# Patient Record
Sex: Male | Born: 2000
Health system: Southern US, Community
[De-identification: ages and names within clinical notes are randomized; demographics above are authoritative.]

---
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
## 2020-02-26 ENCOUNTER — Ambulatory Visit
Admission: RE | Admit: 2020-02-26 | Discharge: 2020-02-26 | Disposition: A | Discharge status: Home / Self Care | Payer: 59 | Attending: Pediatrics | Admitting: Pediatrics

## 2020-02-26 ENCOUNTER — Other Ambulatory Visit: Payer: Self-pay | Admitting: Pediatrics

## 2020-02-26 ENCOUNTER — Other Ambulatory Visit: Payer: Self-pay

## 2020-02-26 ENCOUNTER — Ambulatory Visit
Admission: RE | Admit: 2020-02-26 | Discharge: 2020-02-26 | Disposition: A | Discharge status: Home / Self Care | Payer: 59 | Source: Ambulatory Visit | Attending: Pediatrics | Admitting: Pediatrics

## 2020-02-26 DIAGNOSIS — M25551 Pain in right hip: Secondary | ICD-10-CM

## 2020-03-18 ENCOUNTER — Other Ambulatory Visit: Payer: Self-pay

## 2020-03-18 ENCOUNTER — Ambulatory Visit: Payer: 59 | Admitting: Family Medicine

## 2020-03-18 ENCOUNTER — Encounter: Payer: Self-pay | Admitting: Family Medicine

## 2020-03-18 VITALS — BP 108/62 | HR 92 | Temp 98.4°F | Resp 18 | Ht 73.0 in | Wt 178.0 lb

## 2020-03-18 DIAGNOSIS — Z024 Encounter for examination for driving license: Secondary | ICD-10-CM

## 2020-03-18 NOTE — Progress Notes (Signed)
Subjective:    Patient ID: Gilbert Moore, male    DOB: 12/29/2000, 20 y.o.   MRN: [id number]  Gilbert Moore is a 20 y.o. male presenting on 03/18/2020 for Employment Physical (DOT Physical)   HPI  Patient presents to clinic for DOT PE  No flowsheet data found.  Social History   Tobacco Use  . Smoking status: Never Smoker  . Smokeless tobacco: Never Used  Vaping Use  . Vaping Use: Every day  . Substances: Nicotine, Flavoring  Substance Use Topics  . Alcohol use: Never  . Drug use: Never    Review of Systems  Constitutional: Negative.   HENT: Negative.   Eyes: Negative.   Respiratory: Negative.   Cardiovascular: Negative.   Gastrointestinal: Negative.   Endocrine: Negative.   Genitourinary: Negative.   Musculoskeletal: Negative.   Skin: Negative.   Allergic/Immunologic: Negative.   Neurological: Negative.   Hematological: Negative.   Psychiatric/Behavioral: Negative.    Per HPI unless specifically indicated above     Objective:    BP 108/62 (BP Location: Left Arm, Patient Position: Sitting, Cuff Size: Normal)   Pulse 92   Temp 98.4 F (36.9 C) (Oral)   Resp 18   Ht 6\' 1"  (1.854 m)   Wt 178 lb (80.7 kg)   SpO2 99%   BMI 23.48 kg/m   Wt Readings from Last 3 Encounters:  03/18/20 178 lb (80.7 kg) (79 %, Z= 0.81)*   * Growth percentiles are based on CDC (Boys, 2-20 Years) data.    Physical Exam Vitals reviewed.  Constitutional:      General: He is not in acute distress.    Appearance: Normal appearance. He is well-developed, well-groomed and normal weight. He is not ill-appearing or toxic-appearing.  HENT:     Head: Normocephalic.     Right Ear: Tympanic membrane, ear canal and external ear normal. There is no impacted cerumen.     Left Ear: Tympanic membrane, ear canal and external ear normal. There is no impacted cerumen.     Nose: Nose normal. No congestion or rhinorrhea.     Mouth/Throat:     Mouth: Mucous membranes are moist.      Pharynx: Oropharynx is clear. No oropharyngeal exudate or posterior oropharyngeal erythema.  Eyes:     General: Lids are normal. Vision grossly intact. No scleral icterus.       Right eye: No discharge.        Left eye: No discharge.     Extraocular Movements: Extraocular movements intact.     Conjunctiva/sclera: Conjunctivae normal.     Pupils: Pupils are equal, round, and reactive to light.  Cardiovascular:     Rate and Rhythm: Normal rate and regular rhythm.     Pulses: Normal pulses.          Dorsalis pedis pulses are 2+ on the right side and 2+ on the left side.     Heart sounds: Normal heart sounds. No murmur heard. No friction rub. No gallop.   Pulmonary:     Effort: Pulmonary effort is normal. No respiratory distress.     Breath sounds: Normal breath sounds. No wheezing, rhonchi or rales.  Abdominal:     General: Abdomen is flat. Bowel sounds are normal. There is no distension.     Palpations: Abdomen is soft. There is no hepatomegaly, splenomegaly or mass.     Tenderness: There is no abdominal tenderness. There is no right CVA tenderness, left CVA tenderness, guarding or  rebound.     Hernia: No hernia is present.  Musculoskeletal:        General: Normal range of motion.     Cervical back: Normal range of motion and neck supple. No rigidity or tenderness.     Right lower leg: No edema.     Left lower leg: No edema.     Comments: Normal tone, 5/5 strength BUE & BLE  Feet:     Right foot:     Skin integrity: Skin integrity normal.     Left foot:     Skin integrity: Skin integrity normal.  Lymphadenopathy:     Cervical: No cervical adenopathy.  Skin:    General: Skin is warm and dry.     Capillary Refill: Capillary refill takes less than 2 seconds.  Neurological:     General: No focal deficit present.     Mental Status: He is alert and oriented to person, place, and time.     Cranial Nerves: Cranial nerves are intact. No cranial nerve deficit.     Sensory: Sensation is  intact. No sensory deficit.     Motor: Motor function is intact. No weakness.     Coordination: Coordination is intact. Coordination normal.     Gait: Gait is intact. Gait normal.     Deep Tendon Reflexes: Reflexes are normal and symmetric. Reflexes normal.  Psychiatric:        Attention and Perception: Attention and perception normal.        Mood and Affect: Mood and affect normal.        Speech: Speech normal.        Behavior: Behavior normal. Behavior is cooperative.        Thought Content: Thought content normal.        Cognition and Memory: Cognition and memory normal.        Judgment: Judgment normal.    No results found for this or any previous visit.    Assessment & Plan:   Problem List Items Addressed This Visit      Other   Encounter for Department of Transportation (DOT) examination for trucking license - Primary    DOT Certificate provided x 2 years  Hearing test: Pass at 15' Vision: 20/20 R, 20/20 L, 20/20 Both Uncorrected Urine 1.020, Neg Protein, Neg Glucose, Neg Hematuria         No orders of the defined types were placed in this encounter.  Follow up plan: No follow-ups on file.   Charlaine Dalton, FNP Family Nurse Practitioner [facility] 03/18/2020, 8:58 AM

## 2020-03-18 NOTE — Assessment & Plan Note (Signed)
DOT Certificate provided x 2 years  Hearing test: Pass at 15' Vision: 20/20 R, 20/20 L, 20/20 Both Uncorrected Urine 1.020, Neg Protein, Neg Glucose, Neg Hematuria

## 2021-08-17 ENCOUNTER — Encounter: Payer: Self-pay | Admitting: Urology

## 2021-08-17 ENCOUNTER — Other Ambulatory Visit: Payer: Self-pay

## 2021-08-17 ENCOUNTER — Ambulatory Visit: Payer: 59 | Admitting: Urology

## 2021-08-17 VITALS — BP 124/81 | HR 86 | Ht 74.0 in | Wt 192.0 lb

## 2021-08-17 DIAGNOSIS — Z113 Encounter for screening for infections with a predominantly sexual mode of transmission: Secondary | ICD-10-CM

## 2021-08-17 DIAGNOSIS — N451 Epididymitis: Secondary | ICD-10-CM

## 2021-08-17 DIAGNOSIS — N50819 Testicular pain, unspecified: Secondary | ICD-10-CM

## 2021-08-17 MED ORDER — DOXYCYCLINE HYCLATE 100 MG PO CAPS: 100.0000 mg | ORAL_CAPSULE | Freq: Two times a day (BID) | ORAL | 0 refills | Status: AC

## 2021-08-17 MED ORDER — CEFTRIAXONE SODIUM 1 G IJ SOLR
1.0000 g | Freq: Once | INTRAMUSCULAR | Status: AC
  Administered 2021-08-17: 1 g via INTRAMUSCULAR

## 2021-08-17 MED ORDER — CEFTRIAXONE SODIUM 500 MG IJ SOLR: 500.0000 mg | Freq: Once | INTRAMUSCULAR | Status: DC

## 2021-08-17 NOTE — Patient Instructions (Signed)
Epididymitis  Epididymitis is inflammation or swelling of the epididymis. This is caused by an infection. The epididymis is a cord-like structure that is located along the top and back part of the testicle. It collects and stores sperm from the testicle. This condition can also cause pain and swelling of the testicle and scrotum. Symptoms usually start suddenly (acute epididymitis). Sometimes epididymitis starts gradually and lasts for a while (chronic epididymitis). Chronic epididymitis may be harder to treat. What are the causes? In men ages 20-40, this condition is usually caused by a bacterial infection or a sexually transmitted infection (STI), such as gonorrhea or chlamydia. In men 40 and older, this condition is usually caused by bacteria from a urinary blockage or from abnormalities in the urinary system. These can result from: Having a tube placed into the bladder (urinary catheter). Having an enlarged or inflamed prostate gland. Having recently had urinary tract surgery. Having a problem with a backward flow of urine (retrograde). In men who have a condition that weakens the body's defense system (immune system), such as human immunodeficiency virus (HIV), this condition can be caused by: Other bacteria, including tuberculosis and syphilis. Viruses. Fungi. Sometimes this condition occurs without infection. This may happen because of trauma or repetitive activities such as sports. What increases the risk? You are more likely to develop this condition if you have: Unprotected sex with more than one partner. Anal sex. Had recent surgery. A urinary catheter. Urinary problems. A suppressed immune system. What are the signs or symptoms? This condition usually begins suddenly with chills, fever, and pain behind the scrotum and in the testicle. Other symptoms include: Swelling of the scrotum, testicle, or both. Pain when ejaculating or urinating. Pain in the back or  abdomen. Nausea. Itching and discharge from the penis. A frequent need to pass urine. Redness, increased warmth, and tenderness of the scrotum. How is this diagnosed? Your health care provider can diagnose this condition based on your symptoms and medical history. Your health care provider will also do a physical exam to check your scrotum and testicle for swelling, pain, and redness. You may also have other tests, including: Testing of discharge from the penis. Testing your urine for infections, such as STIs. Ultrasound to check for blood flow and inflammation. Your health care provider may test you for other STIs, including HIV. How is this treated? Treatment for this condition depends on the cause. If your condition is caused by a bacterial infection, oral antibiotic medicine may be prescribed. If the bacterial infection has spread to your blood, you may need to receive IV antibiotics. For both bacterial and nonbacterial epididymitis, you may be treated with: Rest. Elevation of the scrotum. Pain medicines. Anti-inflammatory medicines. Surgery may be needed if: You have pus buildup in the scrotum (abscess). You have epididymitis that has not responded to other treatments. Follow these instructions at home: Medicines Take over-the-counter and prescription medicines only as told by your health care provider. If you were prescribed an antibiotic medicine, take it as told by your health care provider. Do not stop taking the antibiotic even if your condition improves. Sexual activity If your epididymitis was caused by an STI, avoid sexual activity until your treatment is complete. Inform your sexual partner or partners if you test positive for an STI. They may need to be treated. Do not engage in sexual activity with your partner or partners until their treatment is completed. Managing pain and swelling  If directed, raise (elevate) your scrotum and apply ice.   To do this: Put ice in a  plastic bag. Place a small towel or pillow between your legs. Rest your scrotum on the pillow or towel. Place another towel between your skin and the plastic bag. Leave the ice on for 20 minutes, 2-3 times a day. Remove the ice if your skin turns bright red. This is very important. If you cannot feel pain, heat, or cold, you have a greater risk of damage to the area. Keep your scrotum elevated and supported while resting. Ask your health care provider if you should wear a scrotal support, such as a jockstrap. Wear it as told by your health care provider. Try taking a sitz bath to help with discomfort. This is a warm water bath that is taken while you are sitting down. The water should come up to your hips and should cover your buttocks. Do this 3-4 times per day or as told by your health care provider. General instructions Drink enough fluid to keep your urine pale yellow. Return to your normal activities as told by your health care provider. Ask your health care provider what activities are safe for you. Keep all follow-up visits. This is important. Contact a health care provider if: You have a fever. Your pain medicine is not helping. Your pain is getting worse. Your symptoms do not improve within 3 days. Summary Epididymitis is inflammation or swelling of the epididymis. This is caused by an infection. This condition can also cause pain and swelling of the testicle and scrotum. Treatment for this condition depends on the cause. If your condition is caused by a bacterial infection, oral antibiotic medicine may be prescribed. Inform your sexual partner or partners if you test positive for an STI. They may need to be treated. Do not engage in sexual activity with your partner or partners until their treatment is completed. Contact a health care provider if your symptoms do not improve within 3 days. This information is not intended to replace advice given to you by your health care provider.  Make sure you discuss any questions you have with your health care provider. Document Revised: 09/23/2020 Document Reviewed: 09/23/2020 Elsevier Patient Education  2023 [employer]  

## 2021-08-17 NOTE — Progress Notes (Signed)
[employer]

## 2021-08-17 NOTE — Progress Notes (Signed)
   08/17/21 4:06 PM   Annice Pih Bulthuis 06-27-2000 [id number]  CC: Right scrotal pain  HPI: 21 year old healthy male added to clinic schedule today for right-sided testicular pain.  He reports 1 week of discomfort on the right side.  No aggravating or initiating events.  He is sexually active, never been tested for STDs.  He denies any urinary symptoms.  He reportedly gave a urinalysis at a [facility] facility today, but those results are not available.   Social History:  reports that he has never smoked. He has never been exposed to tobacco smoke. He has never used smokeless tobacco. He reports that he does not drink alcohol and does not use drugs.  Physical Exam: BP 124/81   Pulse 86   Ht 6\' 2"  (1.88 m)   Wt 192 lb (87.1 kg)   BMI 24.65 kg/m    Constitutional:  Alert and oriented, No acute distress. Cardiovascular: No clubbing, cyanosis, or edema. Respiratory: Normal respiratory effort, no increased work of breathing. GI: Abdomen is soft, nontender, nondistended, no abdominal masses GU: Phallus with patent meatus, no lesions, left testicle 20 cc and descended without masses, right testicle 20 cc, epididymis firm indurated and tender consistent with epididymitis  Assessment & Plan:   21 year old male with 1 week of right-sided scrotal pain and clinical exam most consistent with epididymitis.  We discussed possible causes including STD versus E. coli, and I recommended empiric treatment with ceftriaxone IM and doxycycline.  We requested that STD testing be added to his urine sample at [facility].  Behavioral strategies discussed including snug fitting underwear, icing, NSAIDs.  Return precautions were discussed extensively.  I recommended 1 month follow-up for repeat exam to confirm resolution of symptoms.  Ceftriaxone and doxycycline given for suspected epididymitis Follow-up UA and STD testing RTC 1 month symptom check  [facility], MD 08/17/2021  [facility] [address] [place], [place] [id number]

## 2021-08-18 LAB — MICROSCOPIC EXAMINATION
Bacteria, UA: NONE SEEN
Casts: NONE SEEN /lpf
Epithelial Cells (non renal): NONE SEEN /hpf (ref 0–10)
RBC, Urine: NONE SEEN /hpf (ref 0–2)

## 2021-08-18 LAB — URINALYSIS, COMPLETE
Bilirubin, UA: NEGATIVE
Glucose, UA: NEGATIVE
Ketones, UA: NEGATIVE
Nitrite, UA: NEGATIVE
Protein,UA: NEGATIVE
RBC, UA: NEGATIVE
Specific Gravity, UA: 1.015 (ref 1.005–1.030)
Urobilinogen, Ur: 1 mg/dL (ref 0.2–1.0)
pH, UA: 7.5 (ref 5.0–7.5)

## 2021-08-19 ENCOUNTER — Telehealth: Payer: Self-pay

## 2021-08-19 LAB — CHLAMYDIA/GONOCOCCUS/TRICHOMONAS, NAA
Chlamydia by NAA: POSITIVE — AB
Gonococcus by NAA: NEGATIVE
Trich vag by NAA: NEGATIVE

## 2021-08-19 NOTE — Telephone Encounter (Signed)
-----   Message from Sondra Come, MD sent at 08/19/2021  8:11 AM EDT ----- STD testing positive for chlamydia. This will be covered by the doxycycline that was prescribed. Recommend he reach out to any recent partners to encourage them to be tested  Legrand Rams, MD 08/19/2021

## 2021-08-19 NOTE — Telephone Encounter (Signed)
Called pt informed him of the information below. Pt voiced understanding. Stressed the importance of informing his partner and not having intercourse until both parties have completed meds. Pt voiced understanding.

## 2021-09-21 ENCOUNTER — Ambulatory Visit: Payer: 59 | Admitting: Urology
